# Patient Record
Sex: Female | Born: 1955 | Race: White | Hispanic: No | Marital: Married | Smoking: Never smoker
Health system: Southern US, Academic
[De-identification: ages and names within clinical notes are randomized; demographics above are authoritative.]

## PROBLEM LIST (undated history)

## (undated) DIAGNOSIS — I219 Acute myocardial infarction, unspecified: Secondary | ICD-10-CM

## (undated) DIAGNOSIS — E039 Hypothyroidism, unspecified: Secondary | ICD-10-CM

## (undated) DIAGNOSIS — E785 Hyperlipidemia, unspecified: Secondary | ICD-10-CM

## (undated) DIAGNOSIS — G629 Polyneuropathy, unspecified: Secondary | ICD-10-CM

## (undated) DIAGNOSIS — E119 Type 2 diabetes mellitus without complications: Secondary | ICD-10-CM

## (undated) DIAGNOSIS — I10 Essential (primary) hypertension: Secondary | ICD-10-CM

## (undated) HISTORY — DX: Hypothyroidism, unspecified: E03.9

## (undated) HISTORY — DX: Acute myocardial infarction, unspecified (CMS HCC): I21.9

## (undated) HISTORY — DX: Polyneuropathy, unspecified: G62.9

## (undated) HISTORY — DX: Type 2 diabetes mellitus without complications (CMS HCC): E11.9

## (undated) HISTORY — DX: Hyperlipidemia, unspecified: E78.5

## (undated) HISTORY — DX: Essential (primary) hypertension: I10

---

## 2021-02-17 IMAGING — MR MRI CERVICAL SPINE WITHOUT CONTRAST
4 of 5 series · 23 of 48 positions shown · IV contrast (gadolinium)
Comparison: None available.

﻿EXAM:  [id number]   MRI CERVICAL SPINE WITHOUT CONTRAST
INDICATION: Neck pain with bilateral upper extremity pain and numbness.
TECHNIQUE: Multiplanar multisequential MRI of the cervical spine was performed without gadolinium contrast.

[Series 5: T2 · sagittal · 3.0mm · 0.75mm/px · 8 of 15 slices shown (1 of 2)]
[im 1/15]
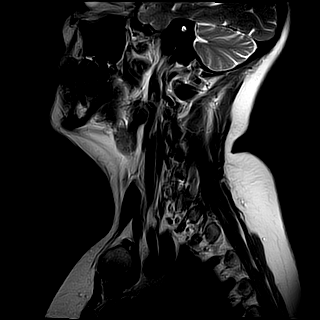
[im 3/15]
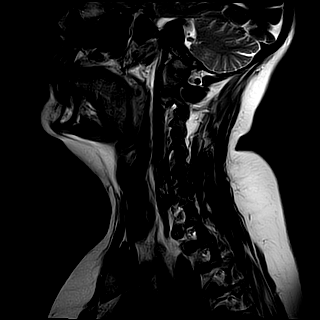
[im 5/15]
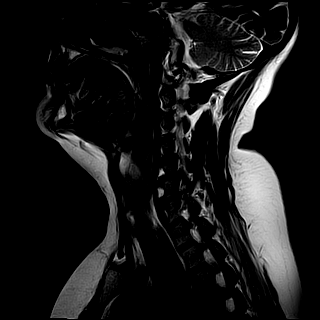
[im 7/15]
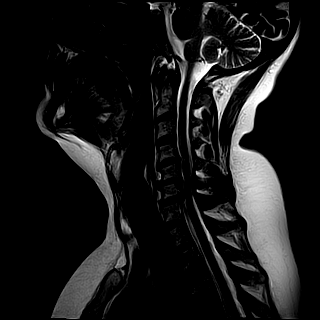
[im 9/15]
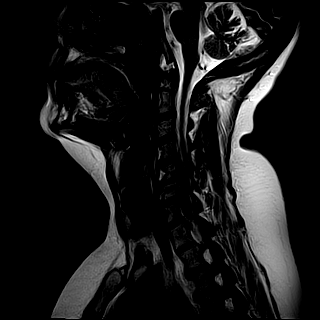
[im 11/15]
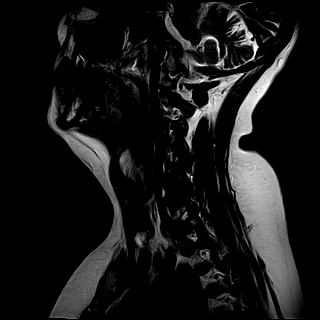
[im 13/15]
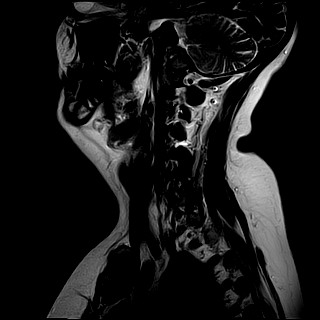
[im 15/15]
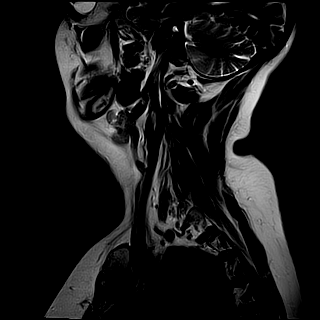

[Series 6: T1 · sagittal · 3.0mm · 0.47mm/px · 3 of 15 slices shown]
[im 2/15]
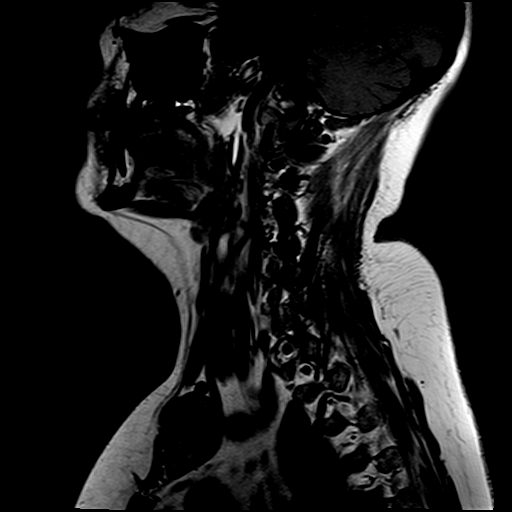
[im 8/15]
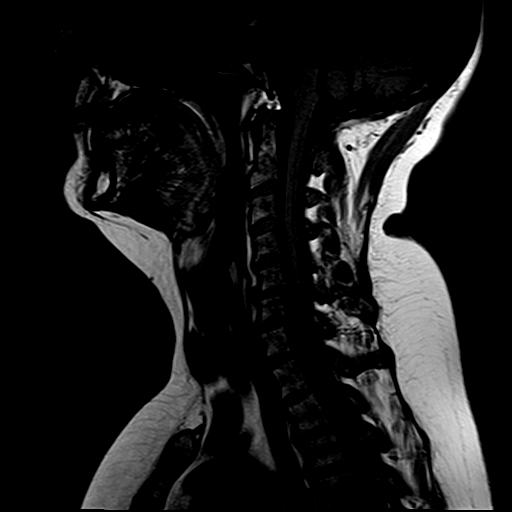
[im 13/15]
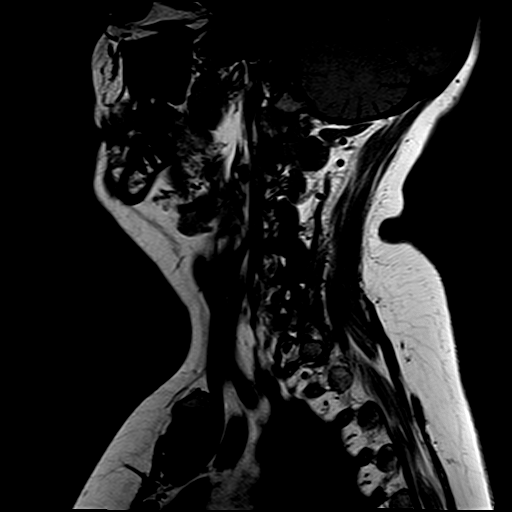

[Series 7: STIR · sagittal · 3.0mm · 0.47mm/px · 3 of 15 slices shown]
[im 2/15]
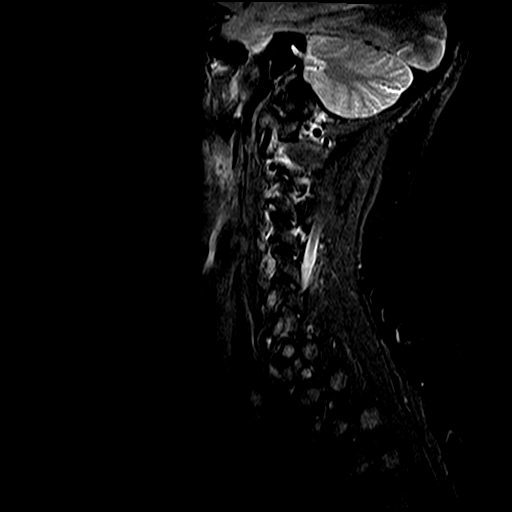
[im 8/15]
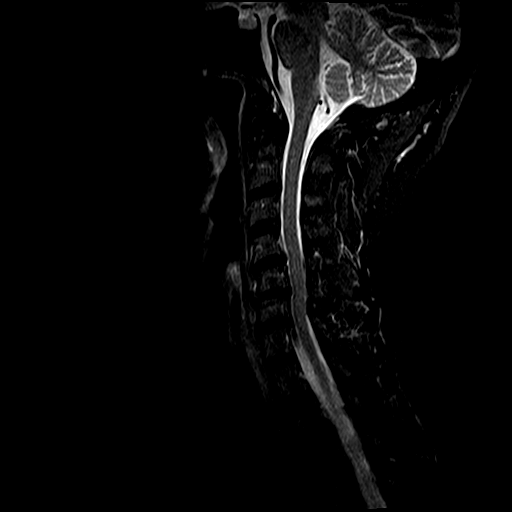
[im 13/15]
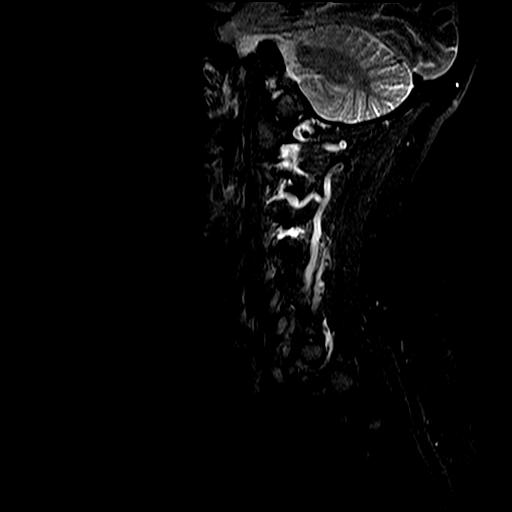

[Series 9: T2 · axial · 3.0mm · 0.39mm/px · z∈[-87,+6]mm · 9 of 18 slices shown (2 of 2)]
[im 1/18]
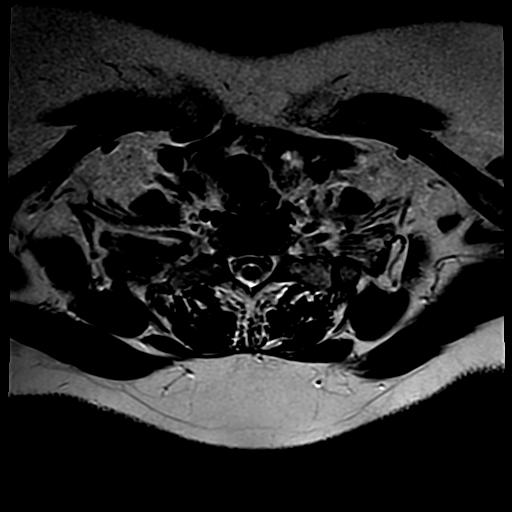
[im 2/18]
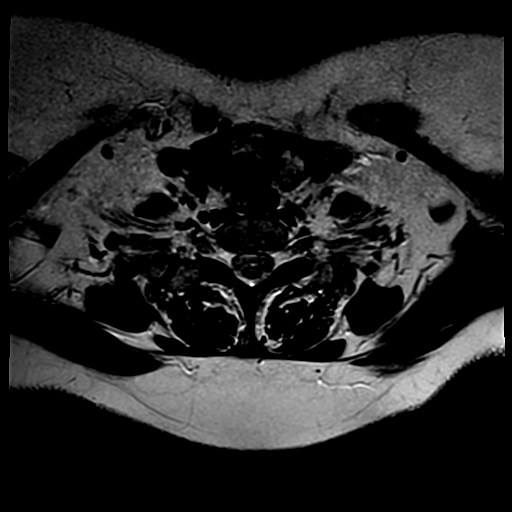
[im 4/18]
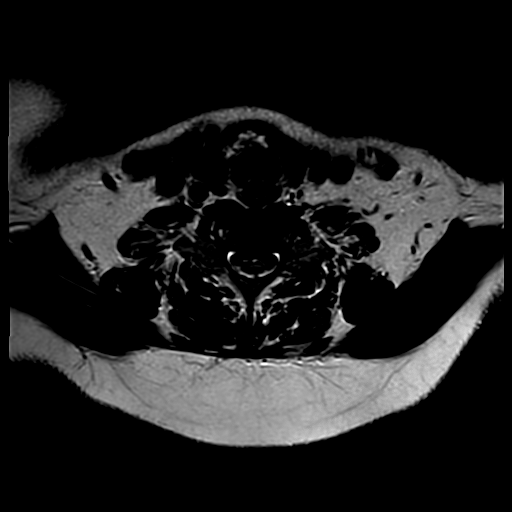
[im 6/18]
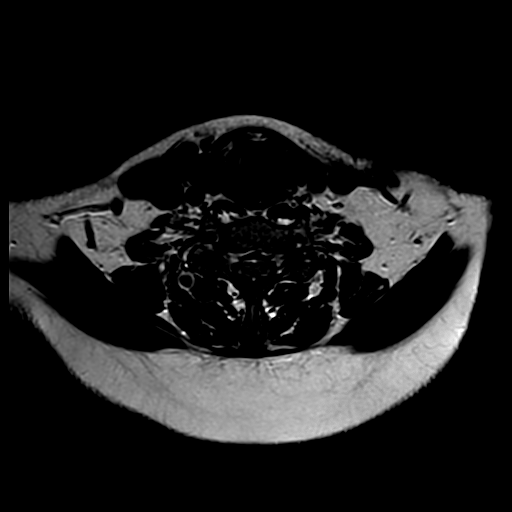
[im 7/18]
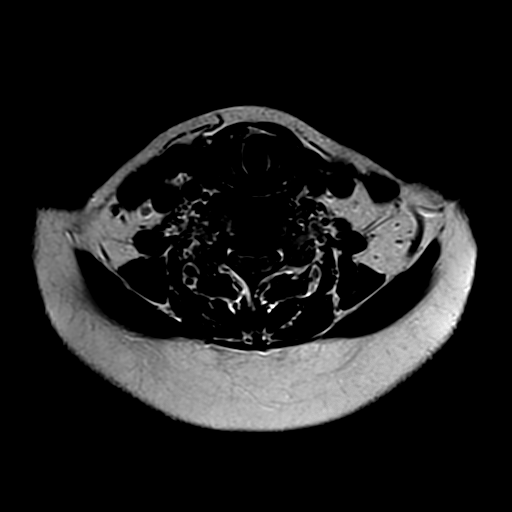
[im 9/18]
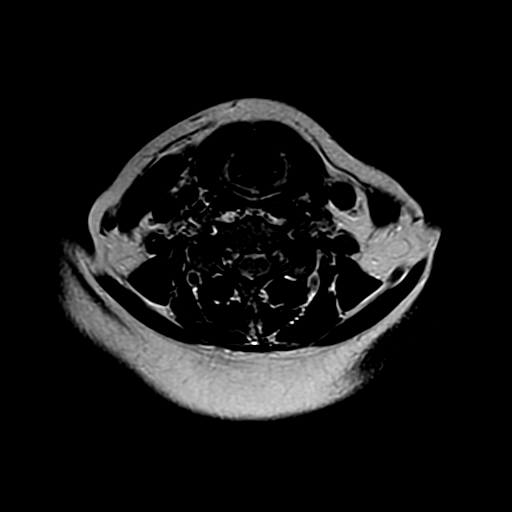
[im 11/18]
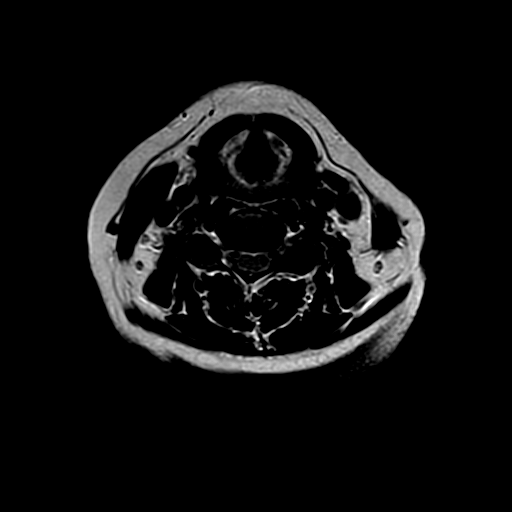
[im 12/18]
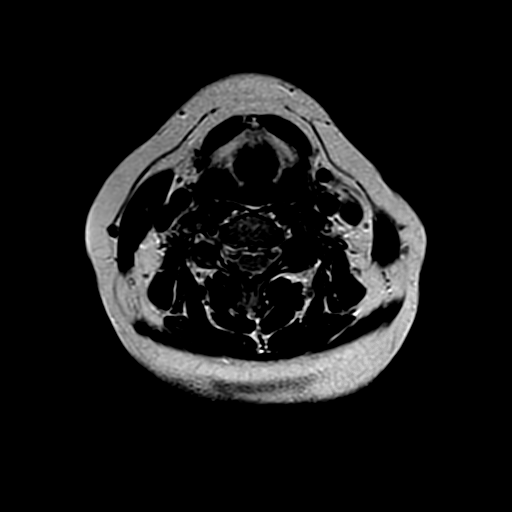
[im 16/18]
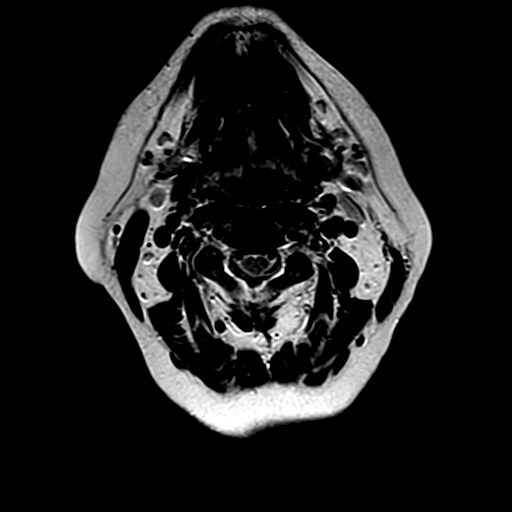

[23 of 48 positions shown; findings below may reference images not displayed]

FINDINGS: Mild reversal of cervical lordosis is likely positional. Bone marrow signal intensity is normal. There is no acute fracture or subluxation. Visualized spinal cord is normal in signal intensity without evidence of compression at any level.

C2-3 and C3-4 levels are unremarkable. 

At C4-5 level, there is a small broad-based central disc osteophyte complex mildly indenting the ventral cord.  There is mild right neural foraminal stenosis from facet and uncovertebral joint hypertrophy. 

At C5-6 level, there is a small broad-based central disc osteophyte complex resulting in mild spinal stenosis.  There is severe right and mild-to-moderate left neural foraminal stenosis from facet and uncovertebral joint hypertrophy. 

At C6-7 level, there is a small broad-based central disc osteophyte complex resulting in mild spinal stenosis.  There is severe bilateral neural foraminal stenosis from uncovertebral joint hypertrophy. 

C7-T1 level and paraspinal soft tissues are unremarkable.  There is suggestion of small thyroid nodules.
IMPRESSION: 1. Mild spinal stenosis C5-6 and C6-7 levels from small central disc osteophyte complexes.  

2. Multilevel neural foraminal stenosis as detailed above. 

3. Suggestion of small thyroid nodules. Follow-up nonemergent thyroid sonogram is recommended for better assessment.

## 2021-02-24 ENCOUNTER — Ambulatory Visit: Payer: Medicare Other

## 2021-05-18 ENCOUNTER — Ambulatory Visit: Payer: Medicare Other | Attending: Orthopaedic Surgery of the Spine | Admitting: Orthopaedic Surgery of the Spine

## 2021-05-18 ENCOUNTER — Encounter (INDEPENDENT_AMBULATORY_CARE_PROVIDER_SITE_OTHER): Payer: Self-pay | Admitting: Orthopaedic Surgery of the Spine

## 2021-05-18 ENCOUNTER — Other Ambulatory Visit: Payer: Self-pay

## 2021-05-18 ENCOUNTER — Other Ambulatory Visit (INDEPENDENT_AMBULATORY_CARE_PROVIDER_SITE_OTHER): Payer: Medicare Other

## 2021-05-18 VITALS — BP 155/90 | HR 75 | Temp 96.1°F | Ht 62.0 in | Wt 221.1 lb

## 2021-05-18 DIAGNOSIS — R52 Pain, unspecified: Secondary | ICD-10-CM

## 2021-05-18 DIAGNOSIS — M25842 Other specified joint disorders, left hand: Secondary | ICD-10-CM

## 2021-05-18 DIAGNOSIS — M1990 Unspecified osteoarthritis, unspecified site: Secondary | ICD-10-CM | POA: Insufficient documentation

## 2021-05-18 DIAGNOSIS — M25841 Other specified joint disorders, right hand: Secondary | ICD-10-CM

## 2021-05-18 DIAGNOSIS — M79642 Pain in left hand: Secondary | ICD-10-CM

## 2021-05-18 DIAGNOSIS — M79641 Pain in right hand: Secondary | ICD-10-CM

## 2021-05-18 LAB — LIGHT GREEN TOP TUBE

## 2021-05-18 LAB — SEDIMENTATION RATE: ERYTHROCYTE SEDIMENTATION RATE (ESR): 8 mm/hr (ref 0–20)

## 2021-05-18 LAB — RHEUMATOID FACTOR, SERUM: RHEUMATOID FACTOR: <13 IU/mL (ref <=30)

## 2021-05-18 LAB — C-REACTIVE PROTEIN(CRP),INFLAMMATION: CRP INFLAMMATION: 2.1 mg/L (ref <8.0)

## 2021-05-18 MED ORDER — GABAPENTIN 300 MG CAPSULE
300.0000 mg | ORAL_CAPSULE | Freq: Three times a day (TID) | ORAL | 0 refills | Status: AC
Start: 2021-05-18 — End: 2021-09-23

## 2021-05-18 NOTE — Progress Notes (Signed)
[facility] [place], [place] [address]    PATIENT NAME: Kristen Tyler NUMBER: [id number]  DATE OF SERVICE: 05/18/2021  DATE OF BIRTH: 10-14-1955     HISTORY AND PHYSICAL     CHIEF COMPLAINT: BL hand pain, tingling, dexterity issues    HPI: Kristen Tyler 66 y.o. female patient presents for evaluation of her bilateral hand pain, tingling, dexterity issues.  She states that around the fall of 2022 she started to lose the grip strength and dexterity of her hands.  She notes that she has pain and stiffness in her hands upon waking from sleep.  She notes numbness and tingling in her bilateral hands which is painful.  She also notes some mild neck pain and popping in her neck.  She states that things such as folding towels, using a calculator, checking her blood pressure, getting food out of the freezer make the pain and discomfort in her hands worse.  She also has difficulty mixing her food with her hands are using a spoon.  She notes some gait imbalance, but this has been present for several years.  It really sounds as though a lot of her hand symptoms are related to hand pain and tingling and tightness rather than her hands frankly not working.    PMH:  Mi, diabetes, hypertension, hyperlipidemia, allergies    PSH:  No previous neck or back surgeries      MEDICATIONS: ascorbic acid, vitamin C, (VITAMIN C) 500 mg Oral Tablet, Every one hour  aspirin (ECOTRIN) 81 mg Oral Tablet, Delayed Release (E.C.), Take 1 Tablet (81 mg total) by mouth  atorvastatin (LIPITOR) 40 mg Oral Tablet, atorvastatin 40 mg tablet   TAKE ONE TABLET BY MOUTH EVERY DAY  Biotin 10 mg Oral Tablet, Take 1 Tablet (10 mg total) by mouth  dilTIAZem (CARDIZEM CD) 240 mg Oral Capsule, Sust. Release 24 hr,   furosemide (LASIX) 20 mg Oral Tablet, furosemide 20 mg tablet   TAKE ONE TABLET BY MOUTH EVERY DAY  isosorbide mononitrate (IMDUR) 60 mg Oral Tablet Sustained Release 24 hr,  isosorbide mononitrate ER 60 mg tablet,extended release 24 hr   TAKE ONE TABLET BY MOUTH EVERY DAY  levothyroxine (SYNTHROID) 75 mcg Oral Tablet, levothyroxine 75 mcg tablet   TAKE ONE TABLET BY MOUTH EVERY MORNING  metFORMIN (GLUCOPHAGE) 500 mg Oral Tablet, metformin 500 mg tablet   TAKE ONE TABLET BY MOUTH 2 TIMES A DAY  nitroGLYCERIN (NITROSTAT) 0.4 mg Sublingual Tablet, Sublingual, nitroglycerin 0.4 mg sublingual tablet  Potassium Gluconate 2.5 mEq Oral Tablet, Every one hour  zinc sulfate (ZINCATE) 50 mg zinc (220 mg) Oral Capsule, Take 220 mg by mouth    No facility-administered medications prior to visit.    ALLERGIES: No Known Allergies  SOCIAL HX:   Social History     Socioeconomic History    Marital status: Unknown     Spouse name: Not on file    Number of children: Not on file    Years of education: Not on file    Highest education level: Not on file   Occupational History    Not on file   Tobacco Use    Smoking status: Never    Smokeless tobacco: Never   Substance and Sexual Activity    Alcohol use: Never    Drug use: Never    Sexual activity: Not on file   Other Topics Concern    Not  on file   Social History Narrative    Not on file     Social Determinants of Health     Financial Resource Strain: Not on file   Transportation Needs: Not on file   Social Connections: Not on file   Intimate Partner Violence: Not on file   Housing Stability: Not on file     FAMILY HX:   Family Medical History:    None         ROS:  Other than mentioned in the HPI, all other review of systems is negative    OBJECTIVE:   Vitals:    05/18/21 1216   BP: (!) 155/90   Pulse: 75   Temp: 35.6 C (96.1 F)   Weight: 100 kg (221 lb 1.9 oz)   Height: 1.575 m (5\' 2" )   BMI: 40.53         On physical exam Kristen Tyler is a well appearing 66 y.o. female in no acute distress.    Normocephalic, atraumatic  Neck is supple with trachea midline  Breathing comfortably on RA without accessory muscle use    Brisk cap refill     Focused SPINE  exam:  Patient demonstrates 5/5 strength in her bilateral upper and lower extremities.  2+ reflexes in bilateral upper and lower extremities.    Inverted BR reflex on the left and a very slight inverted BR reflex on the left.  Negative Hoffmann's bilaterally.  No clonus noted.  Able to perform a tandem gait, but varies from the line about 4-5 steps in but able to correct herself.    IMAGING:  MRI imaging was reviewed which shows scattered degenerative changes of the cervical spine.  There are some scattered neural foraminal narrowing to the lower cervical spine worse maybe on the right rather than the left.  With regards to central stenosis there is mild central stenosis at C4/C5 and C5/C6.  There is moderate stenosis at C6-C7.  There are no cord signal abnormalities noted.    EMG reviewed and demonstrates no cervical radiculopathy or carpal tunnel    ASSESSMENT: 66 y.o.female with symptoms of hand dysfunction, hand pain, hand tingling.    PLAN:  We had a lengthy discussion with the patient and her husband regarding her physical exam and imaging findings.  While she does have a host of complaints with regards to her hands and dysfunction of her hands it almost seems as though most of these complaints are related to pain along with a tightness and tingling sensation rather than being frank myelopathy type symptoms.  We discussed the natural history of myelopathy as well as the signs and symptoms of myelopathy.  While she does have some physical exam findings of myelopathy they are overall mild.  Given the fact that her MRI findings are not overly impressive and her history is a little convoluted as to whether this is truly myelopathy or possibly some other pathology I think the best thing would be to see her back in approximately 4 months for a repeat myelopathy check.  We will start her on some low-dose gabapentin see if this helps with some of her tingling and pain. We will order some rheum labs to make sure this  isn't a contributing factor.  We encouraged her to follow up sooner should she start developing progressive symptoms.     Hedwig MortonJoseph Chavarria, MD  Orthopedic Spine Surgery Fellow  05/18/2021       I personally saw and examined this patient,  reviewed their studies, the resident , fellow or PA note, and dicussed the findings and treatment options with the patient and resident, fellow, or PA.  The date of the signature is simply the date the note was electronically signed , the patient was seen on the date of service indicated in the note.  Haniya Fern Guinea-Bissau, MD 05/18/2021, 18:46

## 2021-05-19 LAB — HEP-2 SUBSTRATE ANTINUCLEAR ANTIBODIES (ANA), SERUM: ANA INTERPRETATION: NEGATIVE

## 2021-05-20 DIAGNOSIS — M50322 Other cervical disc degeneration at C5-C6 level: Secondary | ICD-10-CM

## 2021-09-16 ENCOUNTER — Ambulatory Visit (INDEPENDENT_AMBULATORY_CARE_PROVIDER_SITE_OTHER): Payer: Self-pay | Admitting: Orthopaedic Surgery of the Spine

## 2021-09-23 ENCOUNTER — Encounter (INDEPENDENT_AMBULATORY_CARE_PROVIDER_SITE_OTHER): Payer: Self-pay | Admitting: Orthopaedic Surgery of the Spine

## 2021-09-23 ENCOUNTER — Other Ambulatory Visit: Payer: Self-pay

## 2021-09-23 ENCOUNTER — Ambulatory Visit: Payer: Medicare Other | Attending: Orthopaedic Surgery of the Spine | Admitting: Orthopaedic Surgery of the Spine

## 2021-09-23 DIAGNOSIS — R251 Tremor, unspecified: Secondary | ICD-10-CM | POA: Insufficient documentation

## 2021-09-23 DIAGNOSIS — M4802 Spinal stenosis, cervical region: Secondary | ICD-10-CM | POA: Insufficient documentation

## 2021-09-24 NOTE — Progress Notes (Signed)
PATIENT NAMELENORA, Tyler   HOSPITAL NUMBER:  [id number]  DATE OF SERVICE: 09/23/2021  DATE OF BIRTH:  28-Feb-1955    PROGRESS NOTE    SUBJECTIVE:  Kristen Tyler is a 66 year old female who presents for a myelopathy check.  She presented to [place] initially on May 18, 2021, with the complaints of hand pain and dexterity issues.  I thought at that time that her clinical picture was not consistent with myelopathy at that time and she was scheduled for followup in 4 months.  Currently, she feels that her hand symptoms have worsened.  She specifically mentions that her hand tremors have gotten significantly worse at rest and she also has a tremor when she goes to turn in bed in her neck.  She does have neck and back pain and some gait issues that she describes as difficulty walking due to pain, but denies any falls or any other balance issues.  She has some hand dexterity issues with opening jars and has difficulty with buttoning buttons, but that is primarily due to her tremor.  She denies any new complaints at this time.    OBJECTIVE:  General:  Resting in exam chair in no acute distress.  MSK/spine exam:  She has 5 out of 5 strength to shoulder abduction, elbow flexion, elbow extension, wrist extension, finger flexion, and hand intrinsic muscle movements.  Her sensation is intact to light touch in the C5 to T1 distributions.  Her reflexes are 1+ biceps and triceps with negative inverted brachioradialis bilaterally.  Negative Hoffmann's.  Patellar and Achilles reflexes are 1+.  Negative clonus.  She is able to walk on her tiptoes on her heel pads and her tandem gait.  She has no step-offs and is able to perform with very mild difficulty.    IMAGING:  MRI of the cervical spine was reviewed today in clinic with Dr. Guinea-Bissau.  Per interpretation, there is moderate cervical stenosis at C6-C7 with mild central stenosis at C4-C5 and C5-C6.  There are no other osseous abnormalities noted and no cord signal abnormalities  noted.    ASSESSMENT:  Kristen Tyler is a 66 year old female with symptoms of hand dysfunction secondary to tremors.  She is also having increased sensitivity in her bilateral hands, specifically in her fingertips.    PLAN:  Kristen Tyler is a 66 year old female who presents with hand tremors, difficulty with hand dexterity, and hand sensitivity concerning for possible neurologic etiology.  At this time, we are going to refer to Neurology for further workup to see what their opinion is.  The differential could include diagnoses such as multiple sclerosis and Parkinson disease.  She also has a history of restless legs syndrome, which is well controlled on Requip.  We will follow up with the patient in 6 months for another myelopathy check given her imaging findings.  The patient was agreeable to the plan.  All questions and concerns were answered.        Franchot Mimes, II, MD    Patsie Mccardle Guinea-Bissau, MD  Chief, Section of Spinal Surgery  [facility] Department of Orthopaedics         Jeronimo Greaves, FNP  [address]  [address]  [place], [place] [address]       DD:  09/23/2021 16:16:24  DT:  09/24/2021 06:10:15 DW  D#:  [id number]  I personally saw and examined this patient, reviewed their studies, the resident , fellow or PA note, and dicussed the findings and treatment options with the patient and resident, fellow, or PA.  The date of the signature is simply the date the note was electronically signed , the patient was seen on the date of service indicated in the note.  Rhyse Skowron Guinea-Bissau, MD 09/24/2021, 11:15

## 2021-10-19 ENCOUNTER — Other Ambulatory Visit: Payer: Self-pay

## 2021-10-19 ENCOUNTER — Ambulatory Visit: Payer: Medicare Other | Attending: Neurology | Admitting: Neurology

## 2021-10-19 ENCOUNTER — Other Ambulatory Visit (INDEPENDENT_AMBULATORY_CARE_PROVIDER_SITE_OTHER): Payer: Medicare Other | Admitting: Rheumatology

## 2021-10-19 ENCOUNTER — Encounter (INDEPENDENT_AMBULATORY_CARE_PROVIDER_SITE_OTHER): Payer: Self-pay | Admitting: Neurology

## 2021-10-19 VITALS — BP 169/85 | HR 90 | Temp 97.7°F | Wt 222.8 lb

## 2021-10-19 DIAGNOSIS — E611 Iron deficiency: Secondary | ICD-10-CM

## 2021-10-19 DIAGNOSIS — G629 Polyneuropathy, unspecified: Secondary | ICD-10-CM | POA: Insufficient documentation

## 2021-10-19 DIAGNOSIS — G2581 Restless legs syndrome: Secondary | ICD-10-CM

## 2021-10-19 DIAGNOSIS — G252 Other specified forms of tremor: Secondary | ICD-10-CM

## 2021-10-19 DIAGNOSIS — R251 Tremor, unspecified: Secondary | ICD-10-CM | POA: Insufficient documentation

## 2021-10-19 DIAGNOSIS — G6289 Other specified polyneuropathies: Secondary | ICD-10-CM

## 2021-10-19 LAB — VITAMIN B12: VITAMIN B 12: >2000 pg/mL — ABNORMAL HIGH (ref 200–900)

## 2021-10-19 LAB — FERRITIN: FERRITIN: 34 ng/mL (ref 5–200)

## 2021-10-19 MED ORDER — DIAZEPAM 5 MG TABLET
5.0000 mg | ORAL_TABLET | Freq: Once | ORAL | 0 refills | Status: AC | PRN
Start: 2021-10-19 — End: ?

## 2021-10-19 NOTE — Progress Notes (Unsigned)
Date: 10/20/2021  Name: Valoree Agent  MRN: [id number]  AGE: 66 y.o.  REFERRING PHYSICIAN: Guinea-Bissau, John, MD  [address]  [address]  [place],  [place] [address]    PCP: Heloise Beecham, FNP    CC: New Patient      History Obtained From: patient and husband     HPI: This is a 6 y.o. year old female with PMH significant for MI at age 3, HLD, HTN, hypothyroidism, DM (A1C 6.0), who presents for evaluation of tremors.     She had an MRI C-spine which showed "pressure on my spine." When she was seeing the spine doctor, she was told she had a tremor. She's noticed that she has a tremor in neck when she lies down. She also has sharp pain that shoots through her head. Her eye doctor told her that her vision was stable. She is having a peripheral vision test coming up. The reason she is here is for the tremor and she also has tingling in her fingers on both sides. She had EMG/NCS which was normal. She feels hypersensitivity when she touches things. She also has cramps in her hands and a burning sensation in her fingertips. She also feels that her hands are starting to tremor, especially when her hands hurt. She will get "jittery" when her hands are in pain.     First noticed the tremor in August and it was her head shaking at night. She only notices it when she is lying down. She is otherwise okay. She can feel her head shaking. It does not keep her up. She has noticed a tremor in her right hand in the afternoons/evenings. Typically happens when she is sitting resting, watching TV or in church. She says she drops everything due to the numbness in her fingers, feels like she has no strength in her hands, cannot open a jar, cannot use a calculator. She has difficulty writing because of the tremor. Tremor is not anywhere else. Not in the left hand. No changes in voice. No chin or jaw or tongue tremor.     Has had restless leg syndrome for a couple of years and only started taking medication since Jan 2023. She  takes 1 mg ropinirole at 4pm and 2mg  at night for RLS. No difficulty sleeping at night, does not act out dreams. No change in sense of smell. No constipation.     No FH of tremor. Does not drink any alcohol. Lives at home with her husband. Does not need help with anything at home    On gabapentin 200 mg BID for neuropathy. Could not take the 300 mg because it made her too sleepy. She is not sure why she has neuropathy. EMG/NCS obtained about a year ago was normal.     Past Medical History:   Past Medical History:   Diagnosis Date    DM (diabetes mellitus) (CMS HCC)     HLD (hyperlipidemia)     HTN (hypertension)     Hypothyroidism     Myocardial infarction (CMS HCC)     Neuropathy (CMS HCC)          Outpatient Medications Marked as Taking for the 10/19/21 encounter (Office Visit) with 12/19/21, MD   Medication Sig    ascorbic acid, vitamin C, (VITAMIN C) 500 mg Oral Tablet Every one hour    aspirin (ECOTRIN) 81 mg Oral Tablet, Delayed Release (E.C.) Take 1 Tablet (81 mg total) by mouth  atorvastatin (LIPITOR) 40 mg Oral Tablet atorvastatin 40 mg tablet   TAKE ONE TABLET BY MOUTH EVERY DAY    Biotin 10 mg Oral Tablet Take 1 Tablet (10 mg total) by mouth    diazePAM (VALIUM) 5 mg Oral Tablet Take 1 Tablet (5 mg total) by mouth Once, as needed for Anxiety for up to 1 dose    dilTIAZem (CARDIZEM CD) 240 mg Oral Capsule, Sust. Release 24 hr     furosemide (LASIX) 20 mg Oral Tablet furosemide 20 mg tablet   TAKE ONE TABLET BY MOUTH EVERY DAY    isosorbide mononitrate (IMDUR) 60 mg Oral Tablet Sustained Release 24 hr isosorbide mononitrate ER 60 mg tablet,extended release 24 hr   TAKE ONE TABLET BY MOUTH EVERY DAY    levothyroxine (SYNTHROID) 75 mcg Oral Tablet levothyroxine 75 mcg tablet   TAKE ONE TABLET BY MOUTH EVERY MORNING    melatonin 1 mg Oral Tablet Take 1 Tablet (1 mg total) by mouth Every night    metFORMIN (GLUCOPHAGE) 500 mg Oral Tablet metformin 500 mg tablet   TAKE ONE TABLET BY MOUTH 2 TIMES A DAY     Potassium Gluconate 2.5 mEq Oral Tablet Every one hour    psyllium (METAMUCIL) Packet Take 1 Packet by mouth Twice daily    rOPINIRole (REQUIP) 2 mg Oral Tablet Take 1 Tablet (2 mg total) by mouth Once a day    UNKNOWN MEDICATION (UNKNOWN MEDICATION) Complete women 50 +    zinc sulfate (ZINCATE) 50 mg zinc (220 mg) Oral Capsule Take 220 mg by mouth         Allergies: No Known Allergies    Family History:   Family Medical History:    None         No past surgical history on file.          Social History:   Social History     Socioeconomic History    Marital status: Married   Tobacco Use    Smoking status: Never    Smokeless tobacco: Never   Substance and Sexual Activity    Alcohol use: Never    Drug use: Never       Review of Systems   Constitutional- No fever   Eyes- No visual change   ENT- hearing normal   Cv- no chest pain   Resp- No shortness of breath   Gi- no diarrhea   Gu- bladder normal   Ms- no arthritis   Skin- no rash   Psych- no depression   Heme- no nodes    Physical Exam:    BP (!) 169/85   Pulse 90   Temp 36.5 C (97.7 F) (Thermal Scan)   Wt 101 kg (222 lb 12.8 oz)   SpO2 91%   BMI 40.75 kg/m         Appearance: No acute distress  Orientation: awake, alert, and oriented x3  Mental status:  Memory: Registration 3/3, recall 3/3  Knowledge: appropriate  Language: no aphasia  Speech: no dysarthria  Cranial nerves:   2: No visual defect on confrontation; pupils round, equal, reactive to light   3,4,6: EOMI, no nystagmus   5: facial sensation intact   7: no facial asymmetry   8: hearing grossly intact   9,10: palate symmetric   11: good shoulder shrug   12: tongue midline  Gait: normal base, pace, and stride   Coordination: has intermittent low amplitude (sometimes the amplitude does increase), high frequency tremor that  is most apparent when doing tasks like spirals. There is tremor arrest of the right hand when she performs movement sin the left hand. No bradykinesia with finger taps, fist  open/close or foot stomps.   Sensory: intact to light touch   Muscle Tone: Normal    Muscle Exam:   Deltoid 5  Biceps 5  Triceps 5  Grip 5    Iliopsoas 5  Quads 5  Hamstrings 5  Ankle dorsi flexion 5  Ankle plantar flexion 5    Reflexes: 2/2 throughout        ICD-10-CM    1. Neuropathy (CMS HCC)  G62.9 NCS/EMG     FERRITIN     VITAMIN B12     VITAMIN B1 (THIAMIN), WHOLE BLOOD     VITAMIN B6 (PYRIDOXAL 5-PHOSPHATE), PLASMA     METHYLMALONIC ACID (MMA), QUANTITATIVE, PLASMA      2. Tremor  R25.1 MRI BRAIN W/WO CONTRAST     Refer to Occupational Therapy-External      3. Restless leg syndrome  G25.81 FERRITIN      4. Iron deficiency  E61.1 FERRITIN        Orders Placed This Encounter    MRI BRAIN W/WO CONTRAST    FERRITIN    VITAMIN B12    VITAMIN B1 (THIAMIN), WHOLE BLOOD    VITAMIN B6 (PYRIDOXAL 5-PHOSPHATE), PLASMA    METHYLMALONIC ACID (MMA), QUANTITATIVE, PLASMA    Refer to Occupational Therapy-External    NCS/EMG    diazePAM (VALIUM) 5 mg Oral Tablet     * No order type specified *     Assessment/plan: This is a 66 y.o. year old female with PMH significant for MI at age 75, HLD, HTN, hypothyroidism, DM (A1C 6.0), who presents for evaluation of tremors. The tremor is appeciated on exam and has some degree of distractibility with a possible funcitonal overlay in addition had a somewhat acute onset but the tremor does seem to persist at other times during distraction as well, very low amplitude and high frequency so could be an enhanced physiologic tremor, but would also consider essential tremor given the action component. However, would be unusual for the tremor to be asymmetric. Query whether there is a degree of neuropathic tremor given the patient's significant pain level associated with her symptoms. Patient prefers to clinically monitor and not treat with medication at this time. No signs or symptoms of PD on exam today, no bradykinesia or rigidity.     Plan:   -MRI brain to assess for structural causes of  patient's symptoms   -EMG/NCS and labs as above to evaluate for neuropathy (had EMG one year prior which was normal)  -consider small fiber skin biopsy if EMG is unremarkable  -OT script for tremor  -continue to monitor clinically  -RTC 6 months     On the day of the encounter, a total of 60 minutes was spent on this patient encounter, including review of historical information, examination, documentation, and post-visit activities.         Dionne Bucy, MD   Movement Disorders  Assistant Professor of Neurology  10/20/21

## 2021-10-21 ENCOUNTER — Telehealth (INDEPENDENT_AMBULATORY_CARE_PROVIDER_SITE_OTHER): Payer: Self-pay | Admitting: Neurology

## 2021-10-21 LAB — METHYLMALONIC ACID (MMA), QUANTITATIVE, PLASMA: METHYLMALONIC ACID: 186 nmol/L (ref 87–318)

## 2021-10-21 NOTE — Telephone Encounter (Signed)
-----   Message from Dionne Bucy, MD sent at 10/20/2021  1:28 PM EDT -----  I forgot to provide an OT script for this patient yesterday, can you mail to patient's home address? Thank you!!

## 2021-10-21 NOTE — Telephone Encounter (Signed)
Mailed OT order to patient's home address today. Called patient to update her.

## 2021-10-22 LAB — VITAMIN B6 (PYRIDOXAL 5-PHOSPHATE), PLASMA: VITAMIN B6, PLASMA: 14.3 ng/mL (ref 2.1–21.7)

## 2021-10-23 LAB — VITAMIN B1 (THIAMIN), WHOLE BLOOD: VITAMIN B1 (THIAMINE), BLOOD, LC/MS/MS: 239 nmol/L — ABNORMAL HIGH (ref 78–185)

## 2021-10-29 ENCOUNTER — Telehealth (INDEPENDENT_AMBULATORY_CARE_PROVIDER_SITE_OTHER): Payer: Self-pay | Admitting: Neurology

## 2021-10-29 NOTE — Telephone Encounter (Signed)
Called patient to relay lab results. Ferritin is low side of normal at 34 and may be contributing to her RLS, recommended OTC iron supplementation. Patient also had concerns about numbness going down her leg- she already follows with Dr. Iran for possible myelopathy. Recommended she reach out to Dr. Iran with concerns regarding her spine. All questions answered.     Dionne Bucy, MD  Movement Disorders  Assistant Professor of Neurology

## 2021-11-03 ENCOUNTER — Other Ambulatory Visit: Payer: Self-pay

## 2021-11-03 ENCOUNTER — Inpatient Hospital Stay (HOSPITAL_BASED_OUTPATIENT_CLINIC_OR_DEPARTMENT_OTHER)
Admission: RE | Admit: 2021-11-03 | Discharge: 2021-11-03 | Disposition: A | Discharge status: Home / Self Care | Payer: Medicare Other | Source: Ambulatory Visit | Attending: Neurology | Admitting: Neurology

## 2021-11-03 ENCOUNTER — Inpatient Hospital Stay
Admission: RE | Admit: 2021-11-03 | Discharge: 2021-11-03 | Disposition: A | Discharge status: Home / Self Care | Payer: Medicare Other | Source: Ambulatory Visit | Attending: Neurology | Admitting: Neurology

## 2021-11-03 DIAGNOSIS — G3189 Other specified degenerative diseases of nervous system: Secondary | ICD-10-CM

## 2021-11-03 DIAGNOSIS — G629 Polyneuropathy, unspecified: Secondary | ICD-10-CM

## 2021-11-03 DIAGNOSIS — R251 Tremor, unspecified: Secondary | ICD-10-CM | POA: Insufficient documentation

## 2021-11-03 MED ORDER — GADOBUTROL 1 MMOL/ML (604.72 MG/ML) INTRAVENOUS SOLUTION
10.0000 mL | INTRAVENOUS | Status: AC
Start: 2021-11-03 — End: 2021-11-03
  Administered 2021-11-03: 10 mL via INTRAVENOUS

## 2021-11-29 ENCOUNTER — Telehealth (INDEPENDENT_AMBULATORY_CARE_PROVIDER_SITE_OTHER): Payer: Self-pay | Admitting: Neurology

## 2021-11-29 DIAGNOSIS — R251 Tremor, unspecified: Secondary | ICD-10-CM

## 2021-11-29 MED ORDER — DULOXETINE 30 MG CAPSULE,DELAYED RELEASE
30.0000 mg | DELAYED_RELEASE_CAPSULE | Freq: Every day | ORAL | 3 refills | Status: AC
Start: 2021-11-29 — End: ?

## 2021-11-29 NOTE — Telephone Encounter (Signed)
Called patient and relayed results of MRI and EMG. Explained that neuropathy could contribute to her tremor. She is already on gabapentin per Dr. Iran but is having difficulty tolerating it due to fatigue. Will try Cymbalta 30 mg. All questions answered.     Dionne Bucy, MD  Movement Disorders  Assistant Professor of Neurology

## 2021-12-07 ENCOUNTER — Ambulatory Visit (INDEPENDENT_AMBULATORY_CARE_PROVIDER_SITE_OTHER): Payer: Self-pay | Admitting: Neurology

## 2021-12-07 NOTE — Telephone Encounter (Signed)
Regarding: [place]  ----- Message from [place] sent at 11/26/2021 10:13 AM EST -----  Pt is calling she went to schedule her therapy and the order states OC need a new order stating PT can someone please send this to [facility] in [place]

## 2021-12-07 NOTE — Telephone Encounter (Signed)
Called patient and confirmed that she actually wants PT order mailed to her home address.  Order placed in mail today.

## 2022-02-02 ENCOUNTER — Ambulatory Visit (INDEPENDENT_AMBULATORY_CARE_PROVIDER_SITE_OTHER): Payer: Self-pay | Admitting: Neurology

## 2022-03-24 ENCOUNTER — Ambulatory Visit (INDEPENDENT_AMBULATORY_CARE_PROVIDER_SITE_OTHER): Payer: Self-pay | Admitting: Orthopaedic Surgery of the Spine

## 2022-04-26 ENCOUNTER — Ambulatory Visit (INDEPENDENT_AMBULATORY_CARE_PROVIDER_SITE_OTHER): Payer: Self-pay | Admitting: Neurology

## 2022-05-05 IMAGING — MR MRI LUMBAR SPINE WITHOUT CONTRAST
7 series · 48 of 48 positions shown · non-contrast
Comparison: None available.

﻿EXAM:  [id number]   MRI LUMBAR SPINE WITHOUT CONTRAST
INDICATION: Lumbar spondylosis.
TECHNIQUE: Multiplanar, multisequential MRI of the lumbar spine was performed without contrast.

[Series 4: s-map · sagittal · 10.6mm · 5.31mm/px · 23 of 100 slices shown]
[im 1/100]
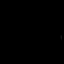
[im 5/100]
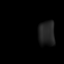
[im 10/100]
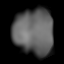
[im 14/100]
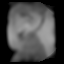
[im 19/100]
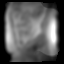
[im 23/100]
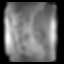
[im 28/100]
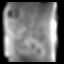
[im 32/100]
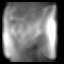
[im 37/100]
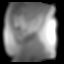
[im 41/100]
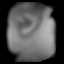
[im 46/100]
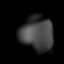
[im 50/100]
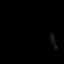
[im 55/100]
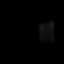
[im 59/100]
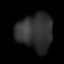
[im 64/100]
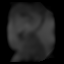
[im 68/100]
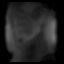
[im 73/100]
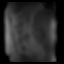
[im 77/100]
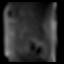
[im 82/100]
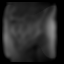
[im 86/100]
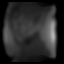
[im 91/100]
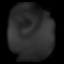
[im 95/100]
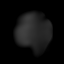
[im 100/100]
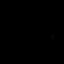

[Series 5: T2 · sagittal · 4.0mm · 0.94mm/px · 3 of 15 slices shown (1 of 3)]
[im 1/15]
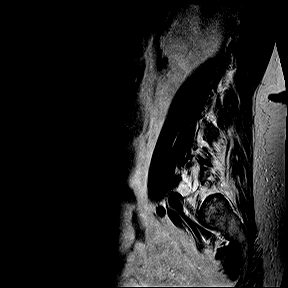
[im 8/15]
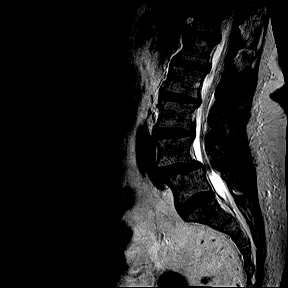
[im 15/15]
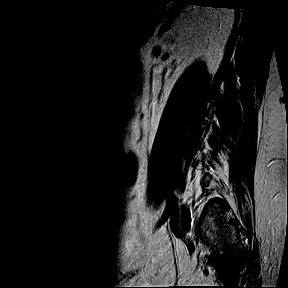

[Series 6: T1 · sagittal · 4.0mm · 0.94mm/px · 3 of 15 slices shown (1 of 2)]
[im 1/15]
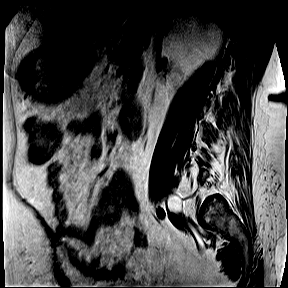
[im 8/15]
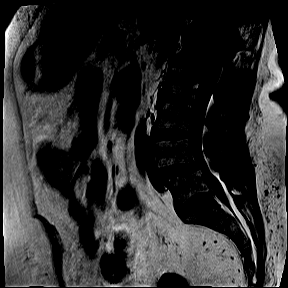
[im 15/15]
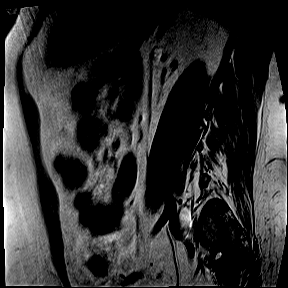

[Series 7: STIR · sagittal · 4.0mm · 1.05mm/px · 3 of 15 slices shown]
[im 1/15]
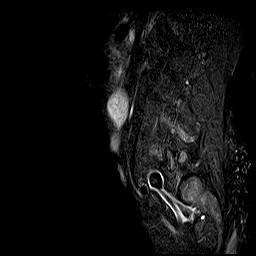
[im 8/15]
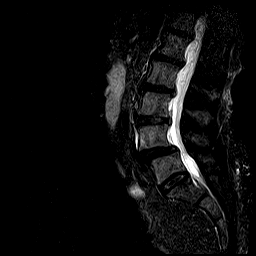
[im 15/15]
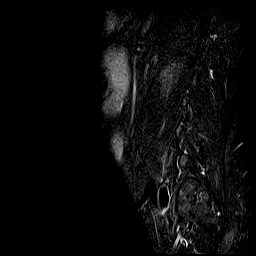

[Series 9: T2 · axial · 4.0mm · 0.52mm/px · z∈[-62,+152]mm · 6 of 26 slices shown (2 of 3)]
[im 1/26]
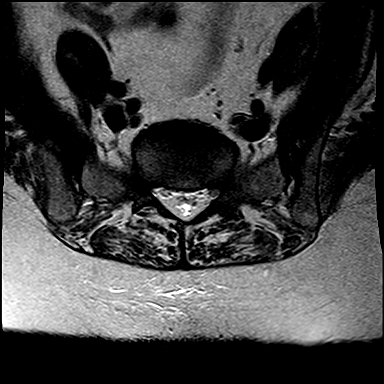
[im 6/26]
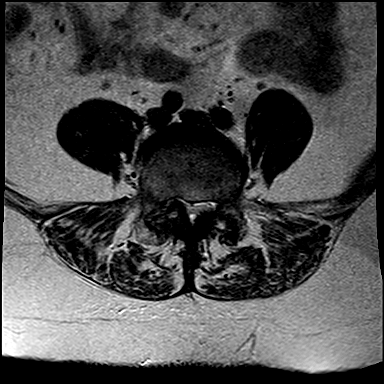
[im 11/26]
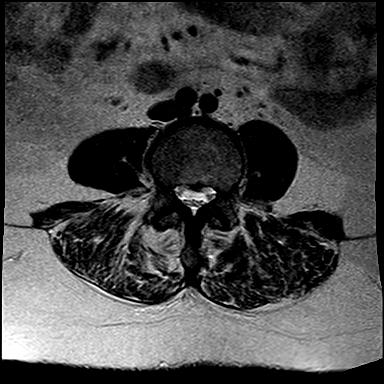
[im 16/26]
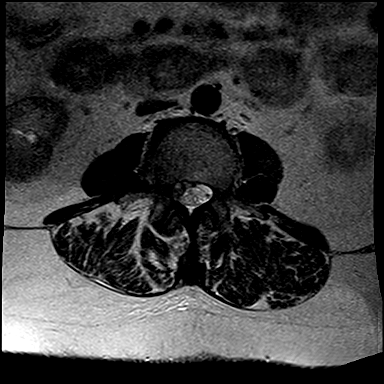
[im 21/26]
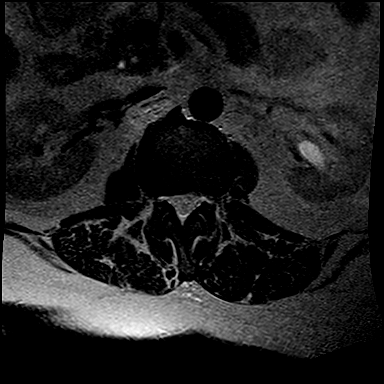
[im 26/26]
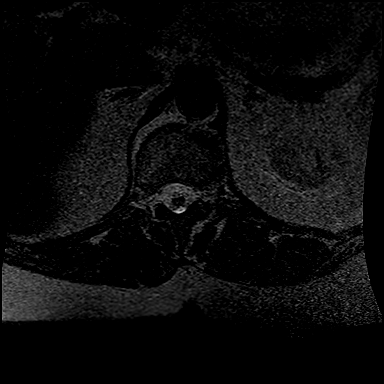

[Series 10: T1 · axial · 4.0mm · 0.52mm/px · z∈[-62,+152]mm · 6 of 26 slices shown (2 of 2)]
[im 1/26]
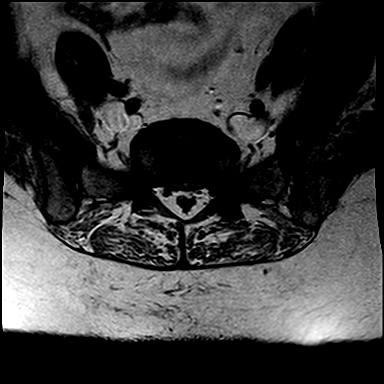
[im 6/26]
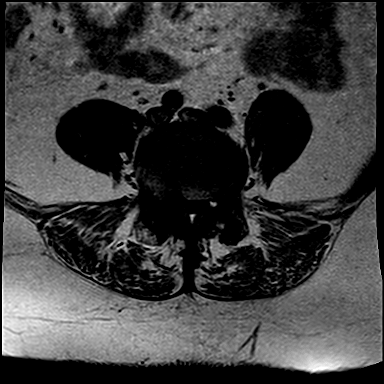
[im 11/26]
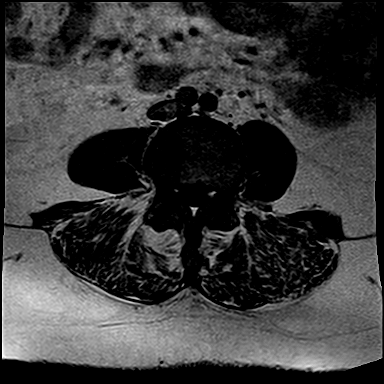
[im 16/26]
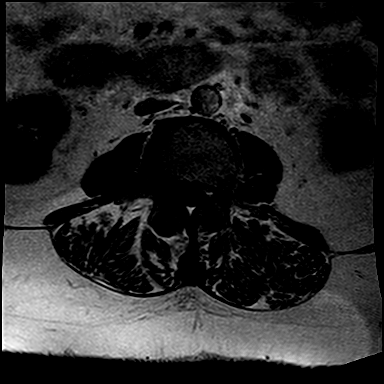
[im 21/26]
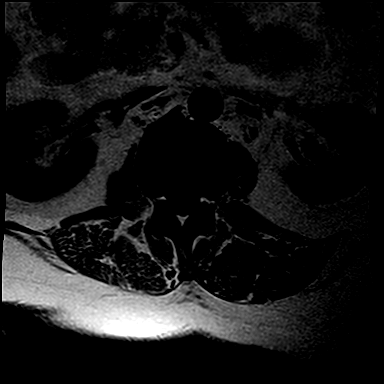
[im 26/26]
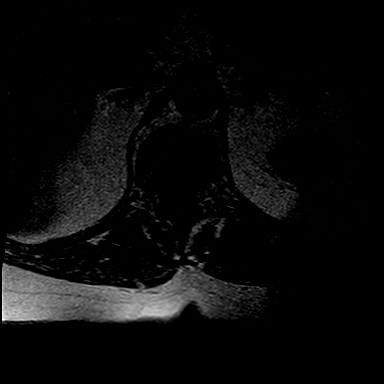

[Series 11: T2 · coronal · 5.0mm · 0.82mm/px · 4 of 18 slices shown (3 of 3)]
[im 1/18]
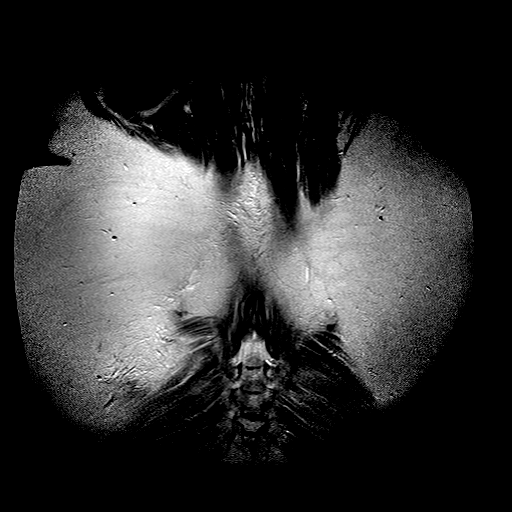
[im 6/18]
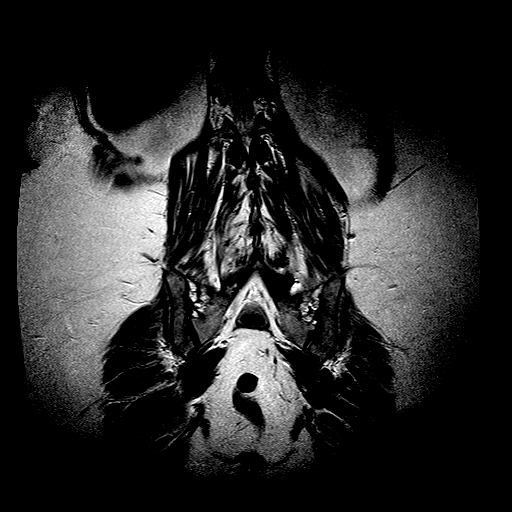
[im 12/18]
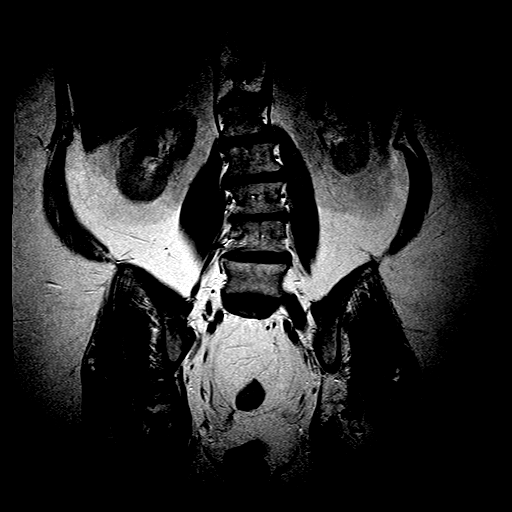
[im 18/18]
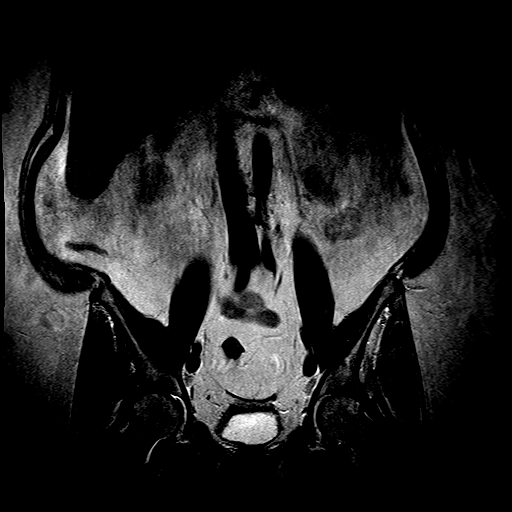

[48 of 48 positions shown; findings below may reference images not displayed]

FINDINGS: There are mild to moderate degenerative changes at multiple levels with disc desiccation, disc space narrowing, and small osteophyte formation.  

There is Grade I L4-5 spondylolisthesis.  Alignment is otherwise intact.  No acute fracture is seen.  

At T12-1 and L1-2 there is mild disc bulging.  

At L2-3 there is moderate disc bulging and moderate spinal stenosis.  There is also a moderate sized disc herniation with extruded disc material extending cauda within the right side of the spinal canal.  

At L3-4 there is mild disc bulging and moderate spinal stenosis with a small left paracentral focal disc protrusion.  

At L4-5 there is moderately severe spinal stenosis produced by a combination of L4-5 spondylolisthesis, mild disc bulging, and hypertrophic changes of the facet joints and ligamentum flavum.  

L5-S1 appears unremarkable.
IMPRESSION: 1. Grade I L4-5 spondylolisthesis with moderately severe L4-5 spinal stenosis.

2. Moderate L2-3 and L3-4 spinal stenosis.

## 2022-05-10 ENCOUNTER — Ambulatory Visit (INDEPENDENT_AMBULATORY_CARE_PROVIDER_SITE_OTHER): Payer: Self-pay | Admitting: Neurology

## 2022-07-26 ENCOUNTER — Ambulatory Visit (INDEPENDENT_AMBULATORY_CARE_PROVIDER_SITE_OTHER): Payer: Self-pay | Admitting: Neurology

## 2022-12-15 ENCOUNTER — Telehealth (INDEPENDENT_AMBULATORY_CARE_PROVIDER_SITE_OTHER): Payer: Self-pay | Admitting: Neurology

## 2022-12-15 NOTE — Telephone Encounter (Signed)
Fax received from patient's pharmacy requesting refill of duloxetine. Patient saw Dr Daisey Must once as a new patient in October 2023 and has not been seen since.  Called patient to ask her if she wants to continue seeing Dr Daisey Must. Kristen Tyler says she liked Dr Daisey Must but it is a 3 hour drive here from her home. Told patient understand completely and requested that she ask her PCP to prescribe medication. Also, told [name] to call clinic if she decides she wants to schedule in the future.
